# Patient Record
Sex: Female | Born: 1960 | Hispanic: Yes | Marital: Single | Smoking: Never smoker
Health system: Southern US, Community
[De-identification: ages and names within clinical notes are randomized; demographics above are authoritative.]

## PROBLEM LIST (undated history)

## (undated) DIAGNOSIS — J45909 Unspecified asthma, uncomplicated: Secondary | ICD-10-CM

---

## 2012-04-25 ENCOUNTER — Emergency Department: Payer: Self-pay | Admitting: Emergency Medicine

## 2012-07-27 ENCOUNTER — Emergency Department: Payer: Self-pay | Admitting: Emergency Medicine

## 2012-07-27 LAB — COMPREHENSIVE METABOLIC PANEL
Alkaline Phosphatase: 109 U/L (ref 50–136)
Anion Gap: 7 (ref 7–16)
Calcium, Total: 9 mg/dL (ref 8.5–10.1)
Chloride: 104 mmol/L (ref 98–107)
Co2: 26 mmol/L (ref 21–32)
EGFR (African American): >60
Glucose: 92 mg/dL (ref 65–99)
Osmolality: 274 (ref 275–301)
Potassium: 3.7 mmol/L (ref 3.5–5.1)
SGOT(AST): 53 U/L — ABNORMAL HIGH (ref 15–37)
Sodium: 137 mmol/L (ref 136–145)

## 2012-07-27 LAB — URINALYSIS, COMPLETE
Bilirubin,UR: NEGATIVE
Blood: NEGATIVE
Glucose,UR: NEGATIVE mg/dL (ref 0–75)
Ketone: NEGATIVE
Ph: 6 (ref 4.5–8.0)
Protein: NEGATIVE
RBC,UR: 1 /HPF (ref 0–5)
Specific Gravity: 1.019 (ref 1.003–1.030)

## 2012-07-27 LAB — CBC
HGB: 13.6 g/dL (ref 12.0–16.0)
MCHC: 34.5 g/dL (ref 32.0–36.0)

## 2012-07-27 LAB — LIPASE, BLOOD: Lipase: 163 U/L (ref 73–393)

## 2013-01-17 ENCOUNTER — Emergency Department: Payer: Self-pay | Admitting: Emergency Medicine

## 2013-01-17 LAB — URINALYSIS, COMPLETE
Bilirubin,UR: NEGATIVE
Blood: NEGATIVE
Glucose,UR: NEGATIVE mg/dL (ref 0–75)
Hyaline Cast: 4
Ketone: NEGATIVE
Ph: 6 (ref 4.5–8.0)
Protein: NEGATIVE
Specific Gravity: 1.031 (ref 1.003–1.030)

## 2013-05-03 ENCOUNTER — Emergency Department: Payer: Self-pay | Admitting: Emergency Medicine

## 2013-05-03 LAB — URINALYSIS, COMPLETE
Ketone: NEGATIVE
Protein: 30
RBC,UR: 28 /HPF (ref 0–5)
Specific Gravity: 1.025 (ref 1.003–1.030)

## 2013-06-07 ENCOUNTER — Emergency Department: Payer: Self-pay | Admitting: Emergency Medicine

## 2014-03-05 ENCOUNTER — Emergency Department: Payer: Self-pay | Admitting: Emergency Medicine

## 2015-01-02 ENCOUNTER — Encounter: Payer: Self-pay | Admitting: Emergency Medicine

## 2015-01-02 ENCOUNTER — Emergency Department
Admission: EM | Admit: 2015-01-02 | Discharge: 2015-01-02 | Disposition: A | Discharge status: Home / Self Care | Payer: Medicaid - Out of State | Attending: Emergency Medicine | Admitting: Emergency Medicine

## 2015-01-02 DIAGNOSIS — M5432 Sciatica, left side: Secondary | ICD-10-CM | POA: Insufficient documentation

## 2015-01-02 HISTORY — DX: Unspecified asthma, uncomplicated: J45.909

## 2015-01-02 MED ORDER — OXYCODONE-ACETAMINOPHEN 5-325 MG PO TABS
ORAL_TABLET | ORAL | Status: AC
  Filled 2015-01-02: qty 1

## 2015-01-02 MED ORDER — OXYCODONE-ACETAMINOPHEN 5-325 MG PO TABS
2.0000 | ORAL_TABLET | Freq: Once | ORAL | Status: AC
  Administered 2015-01-02: 2 via ORAL

## 2015-01-02 MED ORDER — OXYCODONE-ACETAMINOPHEN 5-325 MG PO TABS: 1.0000 | ORAL_TABLET | Freq: Three times a day (TID) | ORAL | Status: DC | PRN

## 2015-01-02 MED ORDER — MELOXICAM 15 MG PO TABS: 15.0000 mg | ORAL_TABLET | Freq: Every day | ORAL | Status: DC

## 2015-01-02 MED ORDER — PREDNISONE 10 MG (21) PO TBPK: ORAL_TABLET | ORAL | Status: DC

## 2015-01-02 NOTE — ED Notes (Signed)
Lower back pain for 3 days.  Denies any injury. 

## 2015-01-02 NOTE — ED Provider Notes (Signed)
[facility] Emergency Department Provider Note   ____________________________________________  Time seen: 12:00  I have reviewed the triage vital signs and the nursing notes.   HISTORY  Chief Complaint Back Pain    HPI Brittney Brock is a 54 y.o. female who presents to the emergency department after 3 days of back pain. She reports the pain started in her left lower back 3 days ago after mopping. The pain radiates from her left lower back into the posterior left leg down to the knee. She's had no relief with Tylenol at home. Movement makes the pain worse. She denies similar symptoms.    Past Medical History  Diagnosis Date  . Asthma     There are no active problems to display for this patient.   History reviewed. No pertinent past surgical history.  Current Outpatient Rx  Name  Route  Sig  Dispense  Refill  . meloxicam (MOBIC) 15 MG tablet   Oral   Take 1 tablet (15 mg total) by mouth daily.   30 tablet   1   . oxyCODONE-acetaminophen (ROXICET) 5-325 MG per tablet   Oral   Take 1 tablet by mouth every 8 (eight) hours as needed for severe pain.   12 tablet   0   . predniSONE (STERAPRED UNI-PAK 21 TAB) 10 MG (21) TBPK tablet      Take 6 tablets on day 1 Take 5 tablets on day 2 Take 4 tablets on day 3 Take 3 tablets on day 4 Take 2 tablets on day 5 Take 1 tablet on day 6   21 tablet   0     Allergies Contrast media  History reviewed. No pertinent family history.  Social History History  Substance Use Topics  . Smoking status: Never Smoker   . Smokeless tobacco: Not on file  . Alcohol Use: No    Review of Systems  Constitutional: Negative for fever. Eyes: Negative for visual changes. ENT: Negative for sore throat. Cardiovascular: Negative for chest pain. Respiratory: Negative for shortness of breath. Gastrointestinal: Negative for abdominal pain, vomiting and diarrhea. Genitourinary: Negative for  dysuria. Musculoskeletal: Positive for back pain. Skin: Negative for rash. Neurological: Negative for headaches, focal weakness or numbness. 10-point ROS otherwise negative.  ____________________________________________   PHYSICAL EXAM:  VITAL SIGNS: ED Triage Vitals  Enc Vitals Group     BP 01/02/15 1126 155/100 mmHg     Pulse Rate 01/02/15 1126 79     Resp 01/02/15 1126 20     Temp 01/02/15 1126 98 F (36.7 C)     Temp Source 01/02/15 1126 Oral     SpO2 01/02/15 1126 98 %     Weight 01/02/15 1126 157 lb (71.215 kg)     Height 01/02/15 1126 5\' 4"  (1.626 m)     Head Cir --      Peak Flow --      Pain Score 01/02/15 1127 9     Pain Loc --      Pain Edu? --      Excl. in GC? --     Constitutional: Alert and oriented. Appears to be in pain but in no distress. Eyes: Conjunctivae are normal. PERRL. Normal extraocular movements. ENT   Head: Normocephalic and atraumatic.   Nose: No congestion/rhinnorhea.   Mouth/Throat: Mucous membranes are moist.   Neck: No stridor. Hematological/Lymphatic/Immunilogical: No cervical lymphadenopathy. Cardiovascular: Normal rate, regular rhythm. Normal and symmetric distal pulses are present in all extremities. Respiratory: Normal respiratory  effort without tachypnea nor retractions. Breath sounds are clear and equal bilaterally. No wheezes/rales/rhonchi. Gastrointestinal: Soft and nontender. No distention. No abdominal bruits. There is no CVA tenderness. Genitourinary: Unremarkable Musculoskeletal: Straight leg raise positive on the left at 40.  Neurologic:  Normal speech and language. No gross focal neurologic deficits are appreciated. Speech is normal. Antalgic gait Skin:  Skin is warm, dry and intact. No rash noted. Psychiatric: Mood and affect are normal. Speech and behavior are normal. Patient exhibits appropriate insight and judgment.  ____________________________________________    LABS (pertinent  positives/negatives)    ____________________________________________   EKG    ____________________________________________    RADIOLOGY    ____________________________________________   PROCEDURES  Procedure(s) performed: None  Critical Care performed: No  ____________________________________________   INITIAL IMPRESSION / ASSESSMENT AND PLAN / ED COURSE  Pertinent labs & imaging results that were available during my care of the patient were reviewed by me and considered in my medical decision making (see chart for details).  Nontraumatic lower back pain with radiation into the left leg consistent with the diagnosis of acute sciatica. We will treat with prednisone, NSAID, and Percocet. Patient was advised to follow-up with orthopedics for symptoms that are not improving over the next 5-7 days. Return precautions for the emergency department were given to patient and her husband.  ____________________________________________   FINAL CLINICAL IMPRESSION(S) / ED DIAGNOSES  Final diagnoses:  Sciatica of left side     Chinita PesterCari B Bereket Gernert, FNP 01/02/15 1317  Loleta Roseory Forbach, MD 01/02/15 1801

## 2015-01-02 NOTE — ED Notes (Signed)
Pt informed to return if any life threatening symptoms occur.  

## 2015-06-10 ENCOUNTER — Encounter: Payer: Self-pay | Admitting: Emergency Medicine

## 2015-06-10 ENCOUNTER — Emergency Department: Payer: Medicaid - Out of State

## 2015-06-10 ENCOUNTER — Emergency Department
Admission: EM | Admit: 2015-06-10 | Discharge: 2015-06-10 | Disposition: A | Discharge status: Home / Self Care | Payer: Medicaid - Out of State | Attending: Emergency Medicine | Admitting: Emergency Medicine

## 2015-06-10 DIAGNOSIS — Z791 Long term (current) use of non-steroidal anti-inflammatories (NSAID): Secondary | ICD-10-CM | POA: Insufficient documentation

## 2015-06-10 DIAGNOSIS — S0003XA Contusion of scalp, initial encounter: Secondary | ICD-10-CM

## 2015-06-10 DIAGNOSIS — S134XXA Sprain of ligaments of cervical spine, initial encounter: Secondary | ICD-10-CM | POA: Insufficient documentation

## 2015-06-10 DIAGNOSIS — W2209XA Striking against other stationary object, initial encounter: Secondary | ICD-10-CM | POA: Insufficient documentation

## 2015-06-10 DIAGNOSIS — S0990XA Unspecified injury of head, initial encounter: Secondary | ICD-10-CM | POA: Diagnosis not present

## 2015-06-10 DIAGNOSIS — S199XXA Unspecified injury of neck, initial encounter: Secondary | ICD-10-CM | POA: Diagnosis present

## 2015-06-10 DIAGNOSIS — Y9389 Activity, other specified: Secondary | ICD-10-CM | POA: Insufficient documentation

## 2015-06-10 DIAGNOSIS — S139XXA Sprain of joints and ligaments of unspecified parts of neck, initial encounter: Secondary | ICD-10-CM

## 2015-06-10 DIAGNOSIS — Y998 Other external cause status: Secondary | ICD-10-CM | POA: Insufficient documentation

## 2015-06-10 DIAGNOSIS — Y9289 Other specified places as the place of occurrence of the external cause: Secondary | ICD-10-CM | POA: Insufficient documentation

## 2015-06-10 MED ORDER — HYDROCODONE-ACETAMINOPHEN 5-325 MG PO TABS: 1.0000 | ORAL_TABLET | ORAL | Status: DC | PRN

## 2015-06-10 MED ORDER — HYDROCODONE-ACETAMINOPHEN 5-325 MG PO TABS
1.0000 | ORAL_TABLET | Freq: Once | ORAL | Status: AC
Start: 2015-06-10 — End: 2015-06-10
  Administered 2015-06-10: 1 via ORAL
  Filled 2015-06-10: qty 1

## 2015-06-10 NOTE — ED Notes (Addendum)
Patient transported to CT 

## 2015-06-10 NOTE — ED Notes (Signed)
Says stood up under deck 3 days ago and hit top of head.  No loc, but has headache and neck pain since.

## 2015-06-10 NOTE — ED Provider Notes (Signed)
[facility] Emergency Department Provider Note  ____________________________________________  Time seen: Approximately 7:32 AM  I have reviewed the triage vital signs and the nursing notes.   HISTORY  Chief Complaint Head Injury and Neck Injury   HPI Brittney Brock is a 54 y.o. female is here with complaint of headache and neck pain since hitting her head on a wooden deck 3 days ago. Husband states that she hit hard enough that she hit the ground, but there was no loss of consciousness. Patient is taken over-the-counter medication without any improvement. She denies any nausea or vomiting. She denies any visual changes. She states the headache is constant, generalized. She denies any photophobia with her headache. She denies any previous history of headaches in the past. She also complains of right-sided neck pain which is worsened with movement and unrelieved with over-the-counter medication. She rates her headache as a 9 out of 10.   Past Medical History  Diagnosis Date  . Asthma     There are no active problems to display for this patient.   No past surgical history on file.  Current Outpatient Rx  Name  Route  Sig  Dispense  Refill  . HYDROcodone-acetaminophen (NORCO/VICODIN) 5-325 MG tablet   Oral   Take 1 tablet by mouth every 4 (four) hours as needed for moderate pain.   20 tablet   0   . meloxicam (MOBIC) 15 MG tablet   Oral   Take 1 tablet (15 mg total) by mouth daily.   30 tablet   1   . oxyCODONE-acetaminophen (ROXICET) 5-325 MG per tablet   Oral   Take 1 tablet by mouth every 8 (eight) hours as needed for severe pain.   12 tablet   0   . predniSONE (STERAPRED UNI-PAK 21 TAB) 10 MG (21) TBPK tablet      Take 6 tablets on day 1 Take 5 tablets on day 2 Take 4 tablets on day 3 Take 3 tablets on day 4 Take 2 tablets on day 5 Take 1 tablet on day 6   21 tablet   0     Allergies Contrast media  No family history on  file.  Social History Social History  Substance Use Topics  . Smoking status: Never Smoker   . Smokeless tobacco: None  . Alcohol Use: No    Review of Systems Constitutional: No fever/chills Eyes: No visual changes. ENT: No facial trauma Cardiovascular: Denies chest pain. Respiratory: Denies shortness of breath. Gastrointestinal: No abdominal pain.  No nausea, no vomiting.  Musculoskeletal: Negative for back pain. Skin: Negative for rash. Neurological: Positive for headaches, no focal weakness or numbness. No paresthesias  10-point ROS otherwise negative.  ____________________________________________   PHYSICAL EXAM:  VITAL SIGNS: ED Triage Vitals  Enc Vitals Group     BP 06/10/15 0718 147/62 mmHg     Pulse Rate 06/10/15 0718 79     Resp 06/10/15 0718 18     Temp 06/10/15 0718 97.8 F (36.6 C)     Temp Source 06/10/15 0718 Oral     SpO2 06/10/15 0718 96 %     Weight 06/10/15 0718 186 lb (84.369 kg)     Height 06/10/15 0718  (1.575 m)     Head Cir --      Peak Flow --      Pain Score 06/10/15 0721 9     Pain Loc --      Pain Edu? --  Excl. in GC? --     Constitutional: Alert and oriented. Well appearing and in no acute distress. Eyes: Conjunctivae are normal. PERRL. EOMI. Head: Atraumatic. Nose: No congestion/rhinnorhea. Neck: No stridor.  Normal tenderness on palpation of the particular body cervical spine. There is some tenderness on palpation right cervical muscles. Range of motion is restricted secondary to pain. Cardiovascular: Normal rate, regular rhythm. Grossly normal heart sounds.  Good peripheral circulation. Respiratory: Normal respiratory effort.  No retractions. Lungs CTAB. Gastrointestinal: Soft and nontender. No distention. Musculoskeletal: Moves upper extremities without difficulty. No lower extremity tenderness nor edema.  No joint effusions. Muscle strength is equal bilaterally. Neurologic:  Normal speech and language. No gross focal  neurologic deficits are appreciated. No gait instability. Reflexes are 2+ bilaterally. Cranial nerves II through XII grossly intact. Skin:  Skin is warm, dry and intact. No rash noted. Tender superior scalp without deformity Psychiatric: Mood and affect are normal. Speech and behavior are normal.  ____________________________________________   LABS (all labs ordered are listed, but only abnormal results are displayed)  Labs Reviewed - No data to display RADIOLOGY  CT scan of the head was negative for acute intracranial abnormalities. CT cervical spine showed mild degenerative changes at C4 and C5 for radiologist ____________________________________________   PROCEDURES  Procedure(s) performed: None  Critical Care performed: No  ____________________________________________   INITIAL IMPRESSION / ASSESSMENT AND PLAN / ED COURSE  Pertinent labs & imaging results that were available during my care of the patient were reviewed by me and considered in my medical decision making (see chart for details).  Patient was discharged on a prescription for Norco as needed for headache. She is to follow-up with her primary care or [facility]  if any continued problems. ____________________________________________   FINAL CLINICAL IMPRESSION(S) / ED DIAGNOSES  Final diagnoses:  Scalp contusion, initial encounter  Cervical sprain, initial encounter      Tommi RumpsRhonda L Randolf Sansoucie, PA-C 06/10/15 1156  Jeanmarie PlantJames A McShane, MD 06/10/15 1529

## 2015-06-10 NOTE — ED Notes (Signed)
Pt returned from CT °

## 2021-03-09 ENCOUNTER — Other Ambulatory Visit: Payer: Self-pay

## 2021-03-09 ENCOUNTER — Emergency Department
Admission: EM | Admit: 2021-03-09 | Discharge: 2021-03-09 | Disposition: A | Discharge status: Home / Self Care | Payer: Medicaid - Out of State | Attending: Emergency Medicine | Admitting: Emergency Medicine

## 2021-03-09 DIAGNOSIS — X501XXA Overexertion from prolonged static or awkward postures, initial encounter: Secondary | ICD-10-CM | POA: Insufficient documentation

## 2021-03-09 DIAGNOSIS — M545 Low back pain, unspecified: Secondary | ICD-10-CM | POA: Insufficient documentation

## 2021-03-09 DIAGNOSIS — J45909 Unspecified asthma, uncomplicated: Secondary | ICD-10-CM | POA: Diagnosis not present

## 2021-03-09 MED ORDER — CYCLOBENZAPRINE HCL 5 MG PO TABS: 5.0000 mg | ORAL_TABLET | Freq: Three times a day (TID) | ORAL | 0 refills | Status: AC | PRN

## 2021-03-09 MED ORDER — IBUPROFEN 400 MG PO TABS
400.0000 mg | ORAL_TABLET | Freq: Once | ORAL | Status: AC
  Administered 2021-03-09: 400 mg via ORAL
  Filled 2021-03-09: qty 1

## 2021-03-09 MED ORDER — CYCLOBENZAPRINE HCL 10 MG PO TABS
10.0000 mg | ORAL_TABLET | Freq: Once | ORAL | Status: AC
  Administered 2021-03-09: 10 mg via ORAL
  Filled 2021-03-09: qty 1

## 2021-03-09 MED ORDER — ACETAMINOPHEN 500 MG PO TABS
1000.0000 mg | ORAL_TABLET | Freq: Once | ORAL | Status: AC
  Administered 2021-03-09: 1000 mg via ORAL
  Filled 2021-03-09: qty 2

## 2021-03-09 MED ORDER — LIDOCAINE 5 % EX PTCH
1.0000 | MEDICATED_PATCH | CUTANEOUS | Status: DC
  Administered 2021-03-09: 1 via TRANSDERMAL
  Filled 2021-03-09: qty 1

## 2021-03-09 NOTE — ED Triage Notes (Signed)
Pt c/o left lower back pain that radiates into the buttock since yesterday, states she was bending over the put lotion on her legs when the pain started.

## 2021-03-09 NOTE — ED Provider Notes (Signed)
[facility] Emergency Department Provider Note  ____________________________________________   Event Date/Time   First MD Initiated Contact with Patient 03/09/21 1206     (approximate)  I have reviewed the triage vital signs and the nursing notes.   HISTORY  Chief Complaint Back Pain   HPI Brittney Brock is a 60 y.o. female past medical history of asthma who presents for assessment of left lower back pain that began yesterday suddenly while she was bending over the lotion on her legs.  She denies any other pain or recent injuries or falls or trauma.  She states the pain feels like it radiates little bit down towards her left leg but nowhere else.  She has not had any incontinence, numbness, extremity weakness, other back pains, chest pain, abdominal pain, nausea, vomiting, cough headache, earache, sore throat or any other recent sick symptoms.  He specifically denies any abnormal vaginal bleeding discharge or urinary symptoms.  She states she took an ibuprofen yesterday but this got significant help with has not taken any pain medicines today.  Denies any history of significant steroid use, malignancy or illicit drug use.         Past Medical History:  Diagnosis Date   Asthma     There are no problems to display for this patient.   History reviewed. No pertinent surgical history.  Prior to Admission medications   Medication Sig Start Date End Date Taking? Authorizing Provider  cyclobenzaprine (FLEXERIL) 5 MG tablet Take 1 tablet (5 mg total) by mouth 3 (three) times daily as needed for up to 3 days for muscle spasms. 03/09/21 03/12/21 Yes Gilles Chiquito, MD    Allergies Contrast media [iodinated diagnostic agents]  No family history on file.  Social History Social History   Tobacco Use   Smoking status: Never   Smokeless tobacco: Never  Substance Use Topics   Alcohol use: No    Review of Systems  Review of Systems  Constitutional:   Negative for chills and fever.  HENT:  Negative for sore throat.   Eyes:  Negative for pain.  Respiratory:  Negative for cough and stridor.   Cardiovascular:  Negative for chest pain.  Gastrointestinal:  Negative for vomiting.  Genitourinary:  Negative for dysuria.  Musculoskeletal:  Positive for back pain. Negative for myalgias.  Skin:  Negative for rash.  Neurological:  Negative for seizures, loss of consciousness and headaches.  Psychiatric/Behavioral:  Negative for suicidal ideas.   All other systems reviewed and are negative.    ____________________________________________   PHYSICAL EXAM:  VITAL SIGNS: ED Triage Vitals [03/09/21 1200]  Enc Vitals Group     BP      Pulse      Resp      Temp      Temp src      SpO2      Weight 155 lb (70.3 kg)     Height 5\' 2"  (1.575 m)     Head Circumference      Peak Flow      Pain Score 10     Pain Loc      Pain Edu?      Excl. in GC?    Vitals:   03/09/21 1235  BP: 138/78  Pulse: 88  Resp: 18  Temp: 98 F (36.7 C)  SpO2: 98%   Physical Exam Vitals and nursing note reviewed.  Constitutional:      General: She is not in acute distress.  Appearance: She is well-developed.  HENT:     Head: Normocephalic and atraumatic.     Right Ear: External ear normal.     Left Ear: External ear normal.     Nose: Nose normal.  Eyes:     Conjunctiva/sclera: Conjunctivae normal.  Cardiovascular:     Rate and Rhythm: Normal rate and regular rhythm.     Pulses: Normal pulses.     Heart sounds: No murmur heard. Pulmonary:     Effort: Pulmonary effort is normal. No respiratory distress.     Breath sounds: Normal breath sounds.  Abdominal:     Palpations: Abdomen is soft.     Tenderness: There is no abdominal tenderness.  Musculoskeletal:     Cervical back: Neck supple.  Skin:    General: Skin is warm and dry.     Capillary Refill: Capillary refill takes less than 2 seconds.  Neurological:     Mental Status: She is alert and  oriented to person, place, and time.     Cranial Nerves: No cranial nerve deficit.  Psychiatric:        Mood and Affect: Mood normal.    Patient is some tenderness over left lower back but no overlying skin changes including erythema, edema or warmth.  No midline tenderness or tenderness of the right back.  Patient has full strength out of bilateral lower extremities.  2+ bilateral patellar reflexes.  Sensation intact light touch throughout both lower extremities.  2+ DP pulses. ____________________________________________   LABS (all labs ordered are listed, but only abnormal results are displayed)  Labs Reviewed - No data to display ____________________________________________  EKG  ____________________________________________  RADIOLOGY  ED MD interpretation:   Official radiology report(s): No results found.  ____________________________________________   PROCEDURES  Procedure(s) performed (including Critical Care):  Procedures   ____________________________________________   INITIAL IMPRESSION / ASSESSMENT AND PLAN / ED COURSE      Patient presents with above-stated history exam for sudden onset left lower back pain that began when she was bending over yesterday with lifting her legs.  She is not taking any pain medicines today.  She states it radiates little bit to her left lower back but denies any associate symptoms including fever, urinary symptoms, abdominal pain, nausea, vomiting, diarrhea or any rash weakness numbness tingling or other clear associated sick symptoms.  She is afebrile hemodynamically stable.  She has an tenderness over the muscles in her left lower paralumbar area but no overlying skin changes or midline tenderness.  She is completely neurovascularly intact.  Suspect likely musculoskeletal strain.  She is negative straight leg test on the right and I will lower suspicion for radiculopathy at this time.  No evidence on exam of a cellulitis and  overall given onset with bending of the lower suspicion for kidney stone or pyelonephritis and overall history and exam is not consistent with acute cord compression or acute infectious process and spinal cord.  Patient given below noted analgesia and discharged with plan for close outpatient PCP follow-up.  Strict return precautions provided and discussed.       ____________________________________________   FINAL CLINICAL IMPRESSION(S) / ED DIAGNOSES  Final diagnoses:  Acute left-sided low back pain, unspecified whether sciatica present    Medications  lidocaine (LIDODERM) 5 % 1 patch (1 patch Transdermal Patch Applied 03/09/21 1301)  acetaminophen (TYLENOL) tablet 1,000 mg (1,000 mg Oral Given 03/09/21 1300)  ibuprofen (ADVIL) tablet 400 mg (400 mg Oral Given 03/09/21 1301)  cyclobenzaprine (FLEXERIL) tablet  10 mg (10 mg Oral Given 03/09/21 1301)     ED Discharge Orders          Ordered    cyclobenzaprine (FLEXERIL) 5 MG tablet  3 times daily PRN        03/09/21 1247             Note:  This document was prepared using Dragon voice recognition software and may include unintentional dictation errors.    Gilles Chiquito, MD 03/09/21 [phone]

## 2021-10-25 ENCOUNTER — Emergency Department: Payer: Medicaid - Out of State

## 2021-10-25 ENCOUNTER — Emergency Department
Admission: EM | Admit: 2021-10-25 | Discharge: 2021-10-25 | Disposition: A | Discharge status: Home / Self Care | Payer: Medicaid - Out of State | Attending: Emergency Medicine | Admitting: Emergency Medicine

## 2021-10-25 ENCOUNTER — Other Ambulatory Visit: Payer: Self-pay

## 2021-10-25 DIAGNOSIS — R059 Cough, unspecified: Secondary | ICD-10-CM | POA: Diagnosis present

## 2021-10-25 DIAGNOSIS — D72819 Decreased white blood cell count, unspecified: Secondary | ICD-10-CM | POA: Insufficient documentation

## 2021-10-25 DIAGNOSIS — U071 COVID-19: Secondary | ICD-10-CM | POA: Insufficient documentation

## 2021-10-25 LAB — CBC WITH DIFFERENTIAL/PLATELET
Abs Immature Granulocytes: 0.02 10*3/uL (ref 0.00–0.07)
Basophils Absolute: 0 10*3/uL (ref 0.0–0.1)
Basophils Relative: 1 %
Eosinophils Absolute: 0 10*3/uL (ref 0.0–0.5)
Eosinophils Relative: 1 %
HCT: 36.6 % (ref 36.0–46.0)
Hemoglobin: 11.8 g/dL — ABNORMAL LOW (ref 12.0–15.0)
Immature Granulocytes: 1 %
Lymphocytes Relative: 23 %
Lymphs Abs: 0.8 10*3/uL (ref 0.7–4.0)
MCH: 26.9 pg (ref 26.0–34.0)
MCHC: 32.2 g/dL (ref 30.0–36.0)
MCV: 83.6 fL (ref 80.0–100.0)
Monocytes Absolute: 0.5 10*3/uL (ref 0.1–1.0)
Monocytes Relative: 14 %
Neutro Abs: 2.1 10*3/uL (ref 1.7–7.7)
Neutrophils Relative %: 60 %
Platelets: 101 10*3/uL — ABNORMAL LOW (ref 150–400)
RBC: 4.38 MIL/uL (ref 3.87–5.11)
RDW: 13.9 % (ref 11.5–15.5)
WBC: 3.3 10*3/uL — ABNORMAL LOW (ref 4.0–10.5)
nRBC: 0 % (ref 0.0–0.2)

## 2021-10-25 LAB — RESP PANEL BY RT-PCR (FLU A&B, COVID) ARPGX2
Influenza A by PCR: NEGATIVE
Influenza B by PCR: NEGATIVE
SARS Coronavirus 2 by RT PCR: POSITIVE — AB

## 2021-10-25 LAB — COMPREHENSIVE METABOLIC PANEL
ALT: 165 U/L — ABNORMAL HIGH (ref 0–44)
AST: 212 U/L — ABNORMAL HIGH (ref 15–41)
Albumin: 3.4 g/dL — ABNORMAL LOW (ref 3.5–5.0)
Alkaline Phosphatase: 100 U/L (ref 38–126)
Anion gap: 8 (ref 5–15)
BUN: 12 mg/dL (ref 6–20)
CO2: 25 mmol/L (ref 22–32)
Calcium: 9 mg/dL (ref 8.9–10.3)
Chloride: 100 mmol/L (ref 98–111)
Creatinine, Ser: 0.6 mg/dL (ref 0.44–1.00)
GFR, Estimated: >60 mL/min (ref >60)
Glucose, Bld: 111 mg/dL — ABNORMAL HIGH (ref 70–99)
Potassium: 4.4 mmol/L (ref 3.5–5.1)
Sodium: 133 mmol/L — ABNORMAL LOW (ref 135–145)
Total Bilirubin: 1 mg/dL (ref 0.3–1.2)
Total Protein: 7.4 g/dL (ref 6.5–8.1)

## 2021-10-25 LAB — LACTIC ACID, PLASMA: Lactic Acid, Venous: 0.8 mmol/L (ref 0.5–1.9)

## 2021-10-25 MED ORDER — IBUPROFEN 600 MG PO TABS
600.0000 mg | ORAL_TABLET | Freq: Once | ORAL | Status: AC
  Administered 2021-10-25: 600 mg via ORAL
  Filled 2021-10-25: qty 1

## 2021-10-25 MED ORDER — ONDANSETRON 4 MG PO TBDP: 4.0000 mg | ORAL_TABLET | Freq: Three times a day (TID) | ORAL | 0 refills | Status: AC | PRN

## 2021-10-25 MED ORDER — BENZONATATE 100 MG PO CAPS: 100.0000 mg | ORAL_CAPSULE | Freq: Three times a day (TID) | ORAL | 0 refills | Status: AC | PRN

## 2021-10-25 MED ORDER — PSEUDOEPH-BROMPHEN-DM 30-2-10 MG/5ML PO SYRP: 10.0000 mL | ORAL_SOLUTION | Freq: Four times a day (QID) | ORAL | 0 refills | Status: AC | PRN

## 2021-10-25 MED ORDER — ACETAMINOPHEN 325 MG PO TABS
650.0000 mg | ORAL_TABLET | Freq: Once | ORAL | Status: AC
  Administered 2021-10-25: 650 mg via ORAL
  Filled 2021-10-25: qty 2

## 2021-10-25 NOTE — ED Provider Notes (Signed)
[facility] Provider Note  Patient Contact: 3:37 PM (approximate)   History   flu like symptoms   HPI  Brittney Brock is a 61 y.o. female who presents the emergency department complaining of headache, chest pain, cough, shortness of breath, sore throat, body aches, fevers, chills.  Patient states that she was exposed to her boyfriend who tested positive for COVID.  No cardiac history.  Patient has had no emesis or diarrhea or constipation.  No urinary changes.  Patient does not take any meds for her symptoms prior to arrival.  She does arrive febrile with a temperature of 102 F.     Physical Exam   Triage Vital Signs: ED Triage Vitals  Enc Vitals Group     BP 10/25/21 1508 126/82     Pulse Rate 10/25/21 1508 93     Resp 10/25/21 1508 16     Temp 10/25/21 1508 (!) 102 F (38.9 C)     Temp Source 10/25/21 1508 Oral     SpO2 10/25/21 1508 98 %     Weight --      Height --      Head Circumference --      Peak Flow --      Pain Score 10/25/21 1429 5     Pain Loc --      Pain Edu? --      Excl. in GC? --     Most recent vital signs: Vitals:   10/25/21 1508 10/25/21 1700  BP: 126/82 129/80  Pulse: 93 93  Resp: 16   Temp: (!) 102 F (38.9 C)   SpO2: 98% 98%     General: Alert and in no acute distress. ENT:      Ears:       Nose: No congestion/rhinnorhea.      Mouth/Throat: Mucous membranes are moist. Neck: No stridor. No cervical spine tenderness to palpation. Hematological/Lymphatic/Immunilogical: No cervical lymphadenopathy. Cardiovascular:  Good peripheral perfusion Respiratory: Normal respiratory effort without tachypnea or retractions. Lungs CTAB. Good air entry to the bases with no decreased or absent breath sounds. Gastrointestinal: Bowel sounds 4 quadrants. Soft and nontender to palpation. No guarding or rigidity. No palpable masses. No distention. No CVA tenderness. Musculoskeletal: Full range of motion to all extremities.   Neurologic:  No gross focal neurologic deficits are appreciated.  Skin:   No rash noted Other:   ED Results / Procedures / Treatments   Labs (all labs ordered are listed, but only abnormal results are displayed) Labs Reviewed  RESP PANEL BY RT-PCR (FLU A&B, COVID) ARPGX2 - Abnormal; Notable for the following components:      Result Value   SARS Coronavirus 2 by RT PCR POSITIVE (*)    All other components within normal limits  COMPREHENSIVE METABOLIC PANEL - Abnormal; Notable for the following components:   Sodium 133 (*)    Glucose, Bld 111 (*)    Albumin 3.4 (*)    AST 212 (*)    ALT 165 (*)    All other components within normal limits  CBC WITH DIFFERENTIAL/PLATELET - Abnormal; Notable for the following components:   WBC 3.3 (*)    Hemoglobin 11.8 (*)    Platelets 101 (*)    All other components within normal limits  LACTIC ACID, PLASMA  LACTIC ACID, PLASMA  URINALYSIS, ROUTINE W REFLEX MICROSCOPIC     EKG  ED ECG REPORT I, Delorise Royals Irven Ingalsbe,  personally viewed and interpreted this ECG.  Date: 10/25/2021  EKG Time: 1517 hrs.  Rate: 87 bpm  Rhythm: there are no previous tracings available for comparison, normal sinus rhythm  Axis: Normal axis  Intervals:none  ST&T Change: No acute ST elevation or depression noted  Normal sinus rhythm, no STEMI.  No previous EKGs for comparison.    RADIOLOGY  I personally viewed and evaluated these images as part of my medical decision making, as well as reviewing the written report by the radiologist.  ED Provider Interpretation: No acute cardiopulmonary findings on chest x-ray  DG Chest 2 View  Result Date: 10/25/2021 CLINICAL DATA:  Cough, fever and chills. EXAM: CHEST - 2 VIEW COMPARISON:  None. FINDINGS: The cardiac silhouette, mediastinal and hilar contours are within normal limits. The lungs are clear of an acute process. No pulmonary lesions or pleural effusions. The bony thorax is intact. IMPRESSION: No acute  cardiopulmonary findings. Electronically Signed   By: Rudie Meyer M.D.   On: 10/25/2021 16:03    PROCEDURES:  Critical Care performed: No  Procedures   MEDICATIONS ORDERED IN ED: Medications  acetaminophen (TYLENOL) tablet 650 mg (650 mg Oral Given 10/25/21 1700)  ibuprofen (ADVIL) tablet 600 mg (600 mg Oral Given 10/25/21 1700)     IMPRESSION / MDM / ASSESSMENT AND PLAN / ED COURSE  I reviewed the triage vital signs and the nursing notes.                              Differential diagnosis includes, but is not limited to, COVID-19, influenza, bronchitis, pneumonia   Patient's diagnosis is consistent with COVID-19.  Patient had a close exposure to COVID-19 with symptoms consistent with same.  She arrived with headache, congestion, sore throat, cough, chest pain, body aches.  Patient had labs, EKG, chest x-ray, viral swab given her symptoms.  EKG and chest x-ray are reassuring with no evidence of pneumonia, bronchitis, STEMI.  Patient has reassuring labs otherwise with mild leukopenia consistent with COVID-19 as well as a positive swab.  At this time patient will have symptom control medications at home.  Follow-up primary care as needed.  Return precautions discussed with the patient.   Patient is given ED precautions to return to the ED for any worsening or new symptoms.        FINAL CLINICAL IMPRESSION(S) / ED DIAGNOSES   Final diagnoses:  COVID-19     Rx / DC Orders   ED Discharge Orders          Ordered    benzonatate (TESSALON PERLES) 100 MG capsule  3 times daily PRN        10/25/21 1811    brompheniramine-pseudoephedrine-DM 30-2-10 MG/5ML syrup  4 times daily PRN        10/25/21 1811    ondansetron (ZOFRAN-ODT) 4 MG disintegrating tablet  Every 8 hours PRN        10/25/21 1811             Note:  This document was prepared using Dragon voice recognition software and may include unintentional dictation errors.   Lanette Hampshire 10/25/21  1811    Phineas Semen, MD 10/25/21 [phone]

## 2021-10-25 NOTE — ED Triage Notes (Signed)
Pt comes with c/o flu like symptoms.  

## 2021-10-25 NOTE — ED Notes (Addendum)
Pt to ED complaining of sore throat, chills, nasal congestion, and HA that all started Sunday (yesterday).  Pt sounds slightly hoarse with speech, pt in NAD and ambulatory to room.  Pt also stating has 9/10 dull mid chest pain, non radiating. Oral temp is 102.0.

## 2023-07-23 IMAGING — CR DG CHEST 2V
2 series · 2 of 2 positions shown · non-contrast
Comparison: None.

CLINICAL DATA: Cough, fever and chills.

EXAM:
CHEST - 2 VIEW

[chest pa]
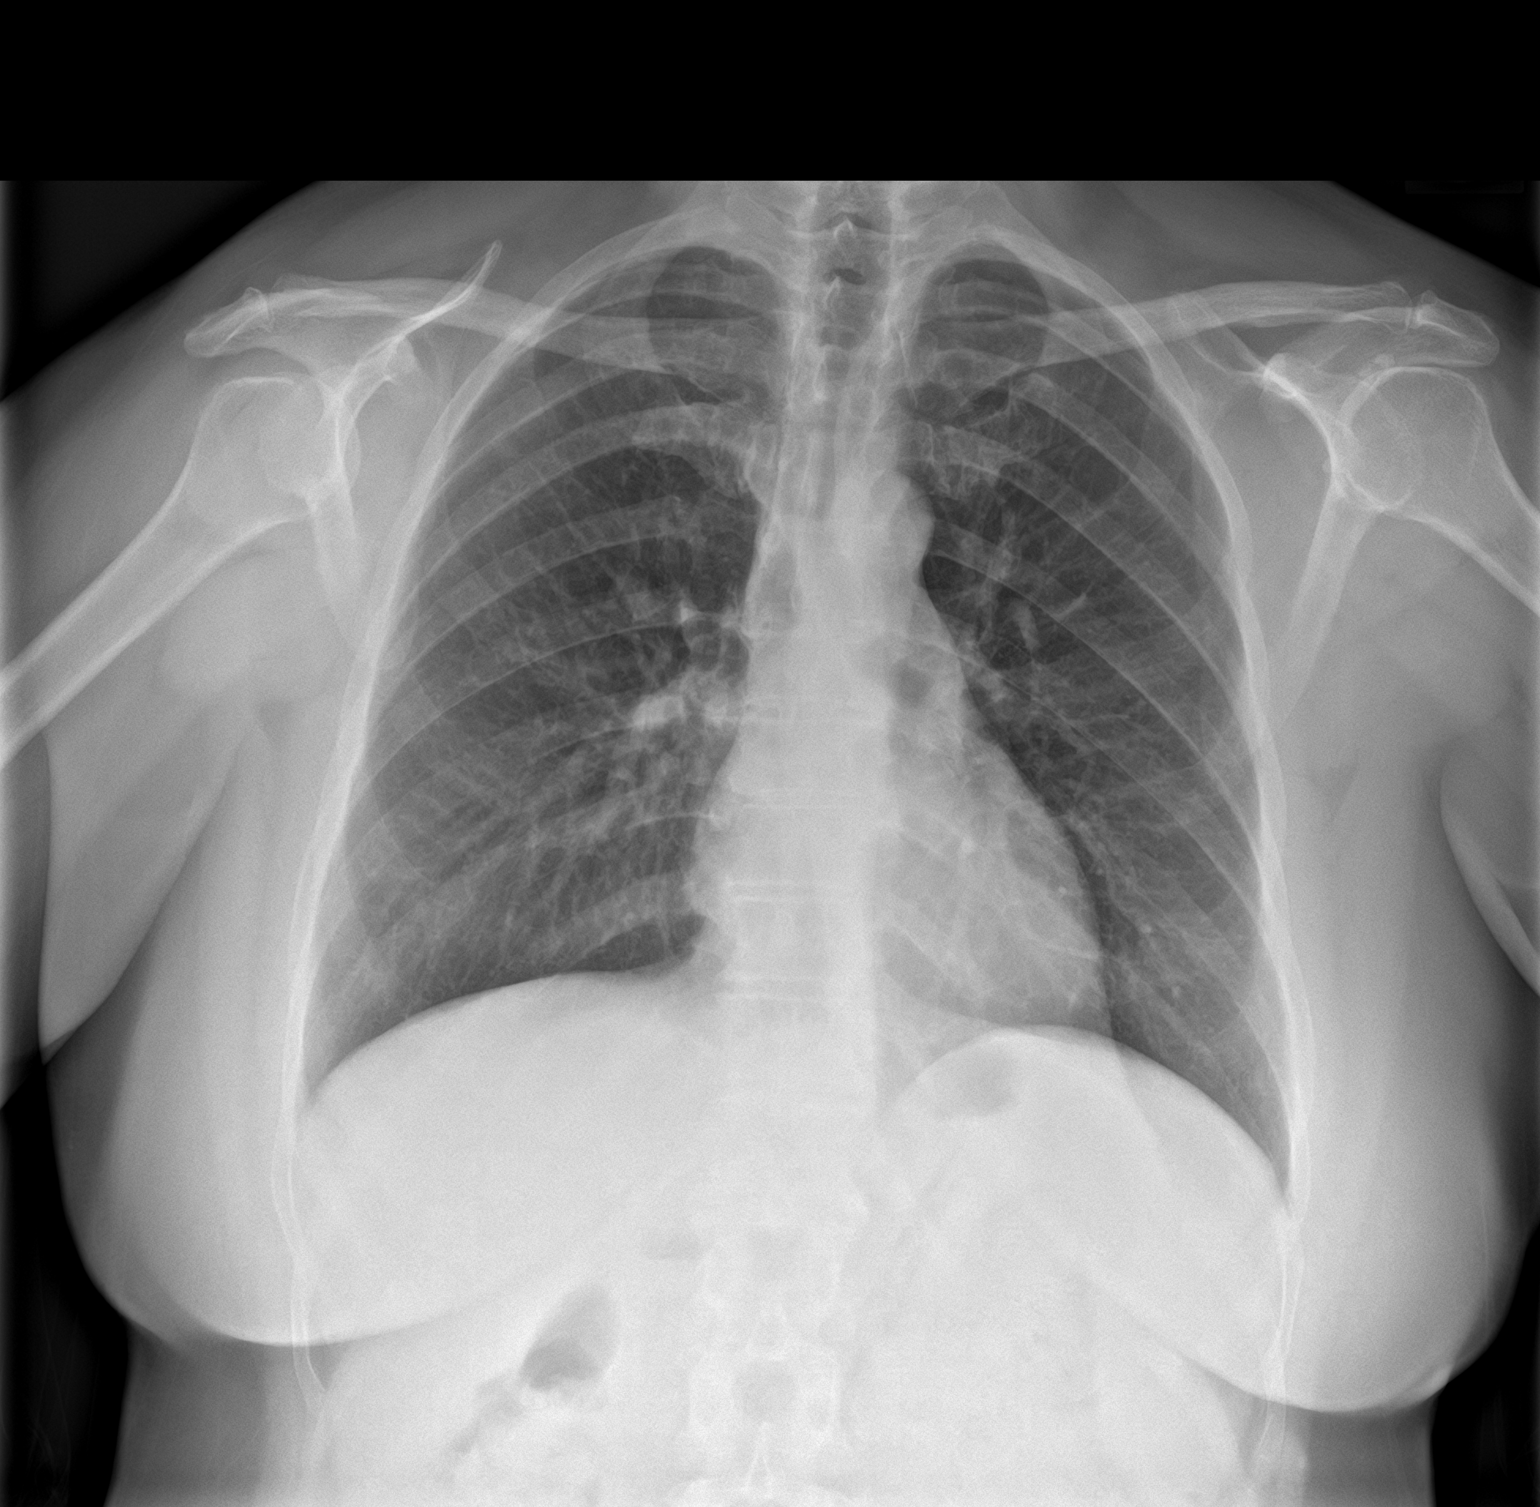

[chest lat]
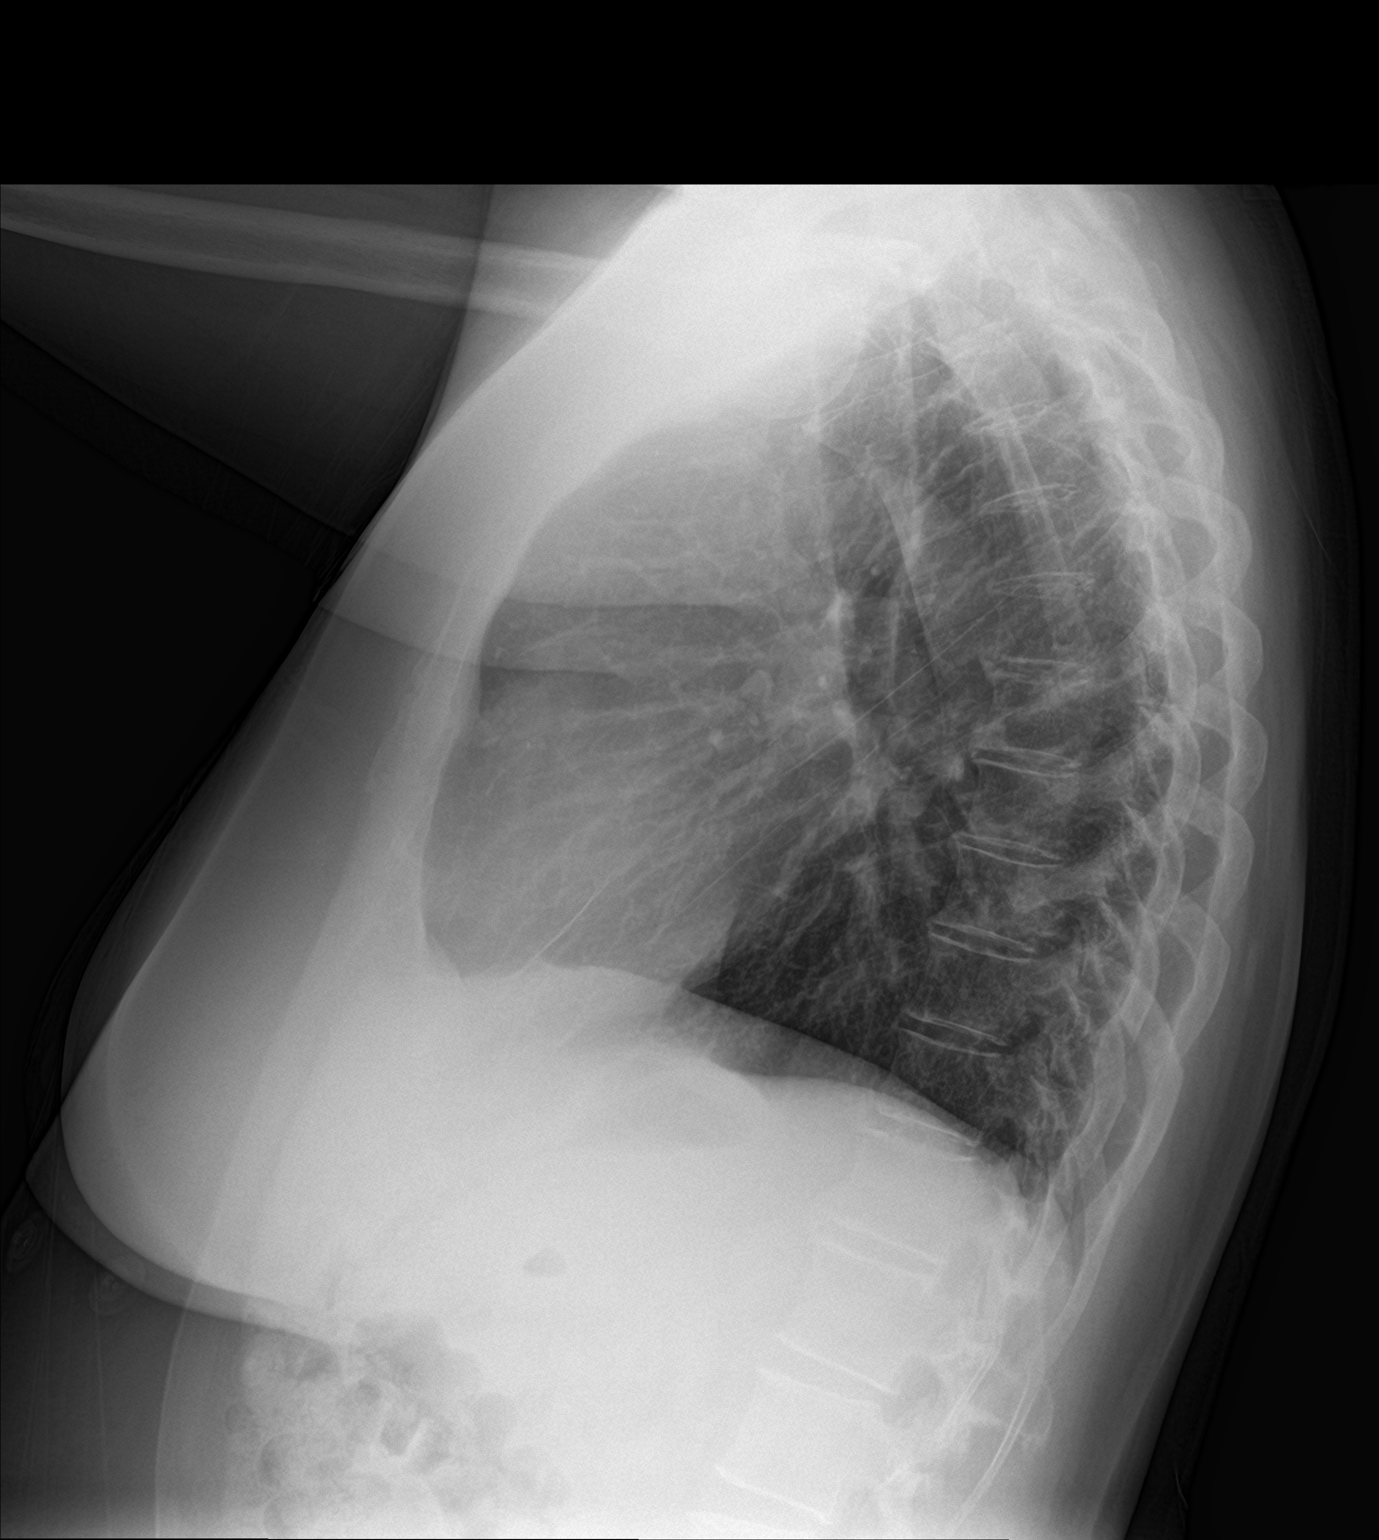

[2 of 2 positions shown; findings below may reference images not displayed]

FINDINGS: The cardiac silhouette, mediastinal and hilar contours are within
normal limits. The lungs are clear of an acute process. No pulmonary
lesions or pleural effusions. The bony thorax is intact.
IMPRESSION: No acute cardiopulmonary findings.

## 2023-07-24 ENCOUNTER — Encounter: Payer: Self-pay | Admitting: Emergency Medicine

## 2023-07-24 ENCOUNTER — Emergency Department
Admission: EM | Admit: 2023-07-24 | Discharge: 2023-07-24 | Disposition: A | Discharge status: Home / Self Care | Payer: Medicaid - Out of State | Attending: Emergency Medicine | Admitting: Emergency Medicine

## 2023-07-24 ENCOUNTER — Other Ambulatory Visit: Payer: Self-pay

## 2023-07-24 ENCOUNTER — Emergency Department: Payer: Medicaid - Out of State

## 2023-07-24 DIAGNOSIS — M7532 Calcific tendinitis of left shoulder: Secondary | ICD-10-CM | POA: Insufficient documentation

## 2023-07-24 DIAGNOSIS — M25512 Pain in left shoulder: Secondary | ICD-10-CM | POA: Diagnosis present

## 2023-07-24 MED ORDER — KETOROLAC TROMETHAMINE 15 MG/ML IJ SOLN
15.0000 mg | Freq: Once | INTRAMUSCULAR | Status: AC
  Administered 2023-07-24: 15 mg via INTRAMUSCULAR
  Filled 2023-07-24: qty 1

## 2023-07-24 MED ORDER — NAPROXEN 500 MG PO TABS: 500.0000 mg | ORAL_TABLET | Freq: Two times a day (BID) | ORAL | 0 refills | Status: AC

## 2023-07-24 NOTE — ED Provider Notes (Signed)
[facility] Provider Note    Event Date/Time   First MD Initiated Contact with Patient 07/24/23 1233     (approximate)   History   Shoulder Pain   HPI  Brittney Brock is a 62 y.o. female with no reported past medical history presents today for evaluation of left shoulder pain for the past 2 weeks.  Patient reports that she is having significant pain with any movement of her shoulder, and reports that she cannot lift her arm above her head to brush her hair and has significant pain with any movement.  She denies chest pain or shortness of breath.  She denies neck pain or back pain.  She has not had any paresthesias or weakness in her arm.  She does not recall any injuries.  There are no problems to display for this patient.         Physical Exam   Triage Vital Signs: ED Triage Vitals  Encounter Vitals Group     BP 07/24/23 1211 139/79     Systolic BP Percentile --      Diastolic BP Percentile --      Pulse Rate 07/24/23 1211 72     Resp 07/24/23 1211 18     Temp 07/24/23 1211 98.2 F (36.8 C)     Temp Source 07/24/23 1211 Oral     SpO2 07/24/23 1211 95 %     Weight 07/24/23 1211 180 lb (81.6 kg)     Height 07/24/23 1211 5\' 2"  (1.575 m)     Head Circumference --      Peak Flow --      Pain Score 07/24/23 1221 10     Pain Loc --      Pain Education --      Exclude from Growth Chart --     Most recent vital signs: Vitals:   07/24/23 1211  BP: 139/79  Pulse: 72  Resp: 18  Temp: 98.2 F (36.8 C)  SpO2: 95%    Physical Exam Vitals and nursing note reviewed.  Constitutional:      General: Awake and alert. No acute distress.    Appearance: Normal appearance. The patient is normal weight.  HENT:     Head: Normocephalic and atraumatic.     Mouth: Mucous membranes are moist.  Eyes:     General: PERRL. Normal EOMs        Right eye: No discharge.        Left eye: No discharge.     Conjunctiva/sclera: Conjunctivae normal.   Cardiovascular:     Rate and Rhythm: Normal rate and regular rhythm.     Pulses: Normal pulses.  Pulmonary:     Effort: Pulmonary effort is normal. No respiratory distress.     Breath sounds: Normal breath sounds.  Abdominal:     Abdomen is soft. There is no abdominal tenderness. No rebound or guarding. No distention. Musculoskeletal:        General: No swelling. Normal range of motion.     Cervical back: Normal range of motion and neck supple. No midline cervical spine tenderness.  Full range of motion of neck.  Negative Spurling test.  Negative Lhermitte sign.  Normal strength and sensation in bilateral upper extremities. Normal grip strength bilaterally.  Normal intrinsic muscle function of the hand bilaterally.  Normal radial pulses bilaterally. Left shoulder: No obvious deformity, swelling, ecchymosis, or erythema No clavicular or AC joint tenderness.  Tenderness to the anterior and lateral shoulder  joint line and minimally to the posterior shoulder joint line Able to actively and passively forward flex and abduct at shoulder to 20 to 30 degrees only and then significant pain, negative drop arm test Pain with attempted Obriens, SLAP, empty can, and lift off tests Normal internal and external rotation against resistance Pain with attempted Hawkins and Neers Normal ROM at elbow and wrist Normal resisted pronation and supination 2+ radial pulse Normal grip strength Normal intrinsic hand muscle function Skin:    General: Skin is warm and dry.     Capillary Refill: Capillary refill takes less than 2 seconds.     Findings: No rash.  Neurological:     Mental Status: The patient is awake and alert.      ED Results / Procedures / Treatments   Labs (all labs ordered are listed, but only abnormal results are displayed) Labs Reviewed - No data to display   EKG     RADIOLOGY I independently reviewed and interpreted imaging and agree with radiologists  findings.     PROCEDURES:  Critical Care performed:   Procedures   MEDICATIONS ORDERED IN ED: Medications  ketorolac (TORADOL) 15 MG/ML injection 15 mg (15 mg Intramuscular Given 07/24/23 1321)     IMPRESSION / MDM / ASSESSMENT AND PLAN / ED COURSE  I reviewed the triage vital signs and the nursing notes.   Differential diagnosis includes, but is not limited to, calcific tendinitis, rotator cuff injury, labral injury, bursitis.  Patient is awake and alert, hemodynamically stable and afebrile.  She is easily reproducible tenderness to her anterior shoulder and pain with any movement of her shoulder, suspicious for calcific tendinitis.  She has a negative drop arm test, I do not suspect complete rotator cuff tear.  She has no cervical spine tenderness, negative Lhermitte and Spurling, do not suspect cervical spine etiology.  She has no radicular symptoms in her arm, no paresthesias or weakness in her arm, normal grip strength.  She has no chest pain or shortness of breath, no upper back pain, no exertional symptoms, I do not suspect acute coronary syndrome or pulmonary embolism.  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  She was treated symptomatically with Toradol with good effect.  She was given a prescription for naproxen for continued analgesia though advised that she should not take this with other NSAIDs.  I recommend that she follow-up with orthopedics for continued management.  The appropriate follow-up information was provided.  We discussed return precautions in the meantime.  Patient understands and agrees with plan.  She was discharged in stable condition.   Patient's presentation is most consistent with acute complicated illness / injury requiring diagnostic workup.   FINAL CLINICAL IMPRESSION(S) / ED DIAGNOSES   Final  diagnoses:  Calcific tendonitis of left shoulder     Rx / DC Orders   ED Discharge Orders          Ordered    naproxen (NAPROSYN) 500 MG tablet  2 times daily with meals        07/24/23 1357             Note:  This document was prepared using Dragon voice recognition software and may include unintentional dictation errors.   Keturah Shavers 07/24/23 1535    Minna Antis, MD 07/25/23 2227

## 2023-07-24 NOTE — Discharge Instructions (Addendum)
Please schedule a follow-up appointment with the orthopedic department for your shoulder.  Please return for any new, worsening, or changing symptoms or other concerns.  It was a pleasure caring for you today.

## 2023-07-24 NOTE — ED Triage Notes (Signed)
Patient to ED via POV for left shoulder pain. Ongoing x2 weeks. No known injury.

## 2023-07-24 NOTE — ED Notes (Signed)
Pt provided discharge instructions and prescription information. Pt was given the opportunity to ask questions and questions were answered.
